# Patient Record
Sex: Female | Born: 1975 | Marital: Single | Smoking: Light tobacco smoker
Health system: Southern US, Community
[De-identification: ages and names within clinical notes are randomized; demographics above are authoritative.]

## PROBLEM LIST (undated history)

## (undated) DIAGNOSIS — F419 Anxiety disorder, unspecified: Secondary | ICD-10-CM

## (undated) DIAGNOSIS — T7840XA Allergy, unspecified, initial encounter: Secondary | ICD-10-CM

## (undated) DIAGNOSIS — K1120 Sialoadenitis, unspecified: Secondary | ICD-10-CM

## (undated) DIAGNOSIS — D649 Anemia, unspecified: Secondary | ICD-10-CM

## (undated) HISTORY — DX: Allergy, unspecified, initial encounter: T78.40XA

## (undated) HISTORY — DX: Anxiety disorder, unspecified: F41.9

## (undated) HISTORY — PX: BREAST SURGERY: SHX581

## (undated) HISTORY — PX: TUBAL LIGATION: SHX77

## (undated) HISTORY — DX: Anemia, unspecified: D64.9

## (undated) HISTORY — DX: Sialoadenitis, unspecified: K11.20

---

## 2015-12-25 DIAGNOSIS — K1120 Sialoadenitis, unspecified: Secondary | ICD-10-CM

## 2015-12-25 HISTORY — DX: Sialoadenitis, unspecified: K11.20

## 2016-10-10 DIAGNOSIS — H40003 Preglaucoma, unspecified, bilateral: Secondary | ICD-10-CM | POA: Diagnosis not present

## 2016-11-06 DIAGNOSIS — R071 Chest pain on breathing: Secondary | ICD-10-CM | POA: Diagnosis not present

## 2016-11-06 DIAGNOSIS — R52 Pain, unspecified: Secondary | ICD-10-CM | POA: Diagnosis not present

## 2016-11-06 DIAGNOSIS — K219 Gastro-esophageal reflux disease without esophagitis: Secondary | ICD-10-CM | POA: Diagnosis not present

## 2016-11-06 DIAGNOSIS — F419 Anxiety disorder, unspecified: Secondary | ICD-10-CM | POA: Diagnosis not present

## 2016-11-06 DIAGNOSIS — Z889 Allergy status to unspecified drugs, medicaments and biological substances status: Secondary | ICD-10-CM | POA: Diagnosis not present

## 2016-11-06 DIAGNOSIS — R079 Chest pain, unspecified: Secondary | ICD-10-CM | POA: Diagnosis not present

## 2016-11-25 DIAGNOSIS — Z6827 Body mass index (BMI) 27.0-27.9, adult: Secondary | ICD-10-CM | POA: Diagnosis not present

## 2016-11-25 DIAGNOSIS — Z01411 Encounter for gynecological examination (general) (routine) with abnormal findings: Secondary | ICD-10-CM | POA: Diagnosis not present

## 2016-11-25 DIAGNOSIS — N921 Excessive and frequent menstruation with irregular cycle: Secondary | ICD-10-CM | POA: Diagnosis not present

## 2017-03-11 DIAGNOSIS — N921 Excessive and frequent menstruation with irregular cycle: Secondary | ICD-10-CM | POA: Diagnosis not present

## 2017-03-11 DIAGNOSIS — D250 Submucous leiomyoma of uterus: Secondary | ICD-10-CM | POA: Diagnosis not present

## 2017-03-11 DIAGNOSIS — D252 Subserosal leiomyoma of uterus: Secondary | ICD-10-CM | POA: Diagnosis not present

## 2017-03-11 DIAGNOSIS — D251 Intramural leiomyoma of uterus: Secondary | ICD-10-CM | POA: Diagnosis not present

## 2017-03-11 DIAGNOSIS — Z3202 Encounter for pregnancy test, result negative: Secondary | ICD-10-CM | POA: Diagnosis not present

## 2017-03-22 HISTORY — PX: ABDOMINAL HYSTERECTOMY: SHX81

## 2017-03-31 DIAGNOSIS — D250 Submucous leiomyoma of uterus: Secondary | ICD-10-CM | POA: Diagnosis not present

## 2017-03-31 DIAGNOSIS — D252 Subserosal leiomyoma of uterus: Secondary | ICD-10-CM | POA: Diagnosis not present

## 2017-03-31 DIAGNOSIS — D251 Intramural leiomyoma of uterus: Secondary | ICD-10-CM | POA: Diagnosis not present

## 2017-03-31 DIAGNOSIS — N939 Abnormal uterine and vaginal bleeding, unspecified: Secondary | ICD-10-CM | POA: Diagnosis not present

## 2017-04-03 DIAGNOSIS — R109 Unspecified abdominal pain: Secondary | ICD-10-CM | POA: Diagnosis not present

## 2017-04-03 DIAGNOSIS — D259 Leiomyoma of uterus, unspecified: Secondary | ICD-10-CM | POA: Diagnosis not present

## 2017-04-03 DIAGNOSIS — G8918 Other acute postprocedural pain: Secondary | ICD-10-CM | POA: Diagnosis not present

## 2017-04-03 DIAGNOSIS — D251 Intramural leiomyoma of uterus: Secondary | ICD-10-CM | POA: Diagnosis not present

## 2017-04-03 DIAGNOSIS — K219 Gastro-esophageal reflux disease without esophagitis: Secondary | ICD-10-CM | POA: Diagnosis not present

## 2017-04-03 DIAGNOSIS — N920 Excessive and frequent menstruation with regular cycle: Secondary | ICD-10-CM | POA: Diagnosis not present

## 2017-04-03 DIAGNOSIS — N939 Abnormal uterine and vaginal bleeding, unspecified: Secondary | ICD-10-CM | POA: Diagnosis not present

## 2017-04-03 DIAGNOSIS — Z79899 Other long term (current) drug therapy: Secondary | ICD-10-CM | POA: Diagnosis not present

## 2017-04-03 DIAGNOSIS — N840 Polyp of corpus uteri: Secondary | ICD-10-CM | POA: Diagnosis not present

## 2017-04-03 DIAGNOSIS — D252 Subserosal leiomyoma of uterus: Secondary | ICD-10-CM | POA: Diagnosis not present

## 2017-04-03 DIAGNOSIS — D250 Submucous leiomyoma of uterus: Secondary | ICD-10-CM | POA: Diagnosis not present

## 2017-04-03 DIAGNOSIS — J302 Other seasonal allergic rhinitis: Secondary | ICD-10-CM | POA: Diagnosis not present

## 2017-04-03 DIAGNOSIS — Z791 Long term (current) use of non-steroidal anti-inflammatories (NSAID): Secondary | ICD-10-CM | POA: Diagnosis not present

## 2017-04-03 DIAGNOSIS — Z7951 Long term (current) use of inhaled steroids: Secondary | ICD-10-CM | POA: Diagnosis not present

## 2017-04-03 DIAGNOSIS — F419 Anxiety disorder, unspecified: Secondary | ICD-10-CM | POA: Diagnosis not present

## 2017-04-21 DIAGNOSIS — Z9889 Other specified postprocedural states: Secondary | ICD-10-CM | POA: Diagnosis not present

## 2017-04-21 DIAGNOSIS — Z90711 Acquired absence of uterus with remaining cervical stump: Secondary | ICD-10-CM | POA: Diagnosis not present

## 2017-04-29 DIAGNOSIS — D649 Anemia, unspecified: Secondary | ICD-10-CM | POA: Diagnosis not present

## 2017-05-21 DIAGNOSIS — D649 Anemia, unspecified: Secondary | ICD-10-CM | POA: Diagnosis not present

## 2017-05-21 DIAGNOSIS — B373 Candidiasis of vulva and vagina: Secondary | ICD-10-CM | POA: Diagnosis not present

## 2017-05-21 DIAGNOSIS — R1030 Lower abdominal pain, unspecified: Secondary | ICD-10-CM | POA: Diagnosis not present

## 2017-05-21 DIAGNOSIS — Z09 Encounter for follow-up examination after completed treatment for conditions other than malignant neoplasm: Secondary | ICD-10-CM | POA: Diagnosis not present

## 2017-10-02 ENCOUNTER — Other Ambulatory Visit: Payer: Self-pay

## 2017-10-02 ENCOUNTER — Ambulatory Visit: Payer: BLUE CROSS/BLUE SHIELD | Admitting: Physician Assistant

## 2017-10-02 ENCOUNTER — Encounter: Payer: Self-pay | Admitting: Physician Assistant

## 2017-10-02 VITALS — BP 106/86 | HR 88 | Temp 98.3°F | Resp 16 | Ht 59.5 in | Wt 131.0 lb

## 2017-10-02 DIAGNOSIS — G8929 Other chronic pain: Secondary | ICD-10-CM | POA: Diagnosis not present

## 2017-10-02 DIAGNOSIS — K30 Functional dyspepsia: Secondary | ICD-10-CM | POA: Diagnosis not present

## 2017-10-02 DIAGNOSIS — R1013 Epigastric pain: Secondary | ICD-10-CM

## 2017-10-02 DIAGNOSIS — J302 Other seasonal allergic rhinitis: Secondary | ICD-10-CM

## 2017-10-02 MED ORDER — ESOMEPRAZOLE MAGNESIUM 20 MG PO CPDR: 20.0000 mg | DELAYED_RELEASE_CAPSULE | Freq: Every day | ORAL | 0 refills | Status: DC

## 2017-10-02 MED ORDER — CETIRIZINE HCL 10 MG PO TABS: 10.0000 mg | ORAL_TABLET | Freq: Every day | ORAL | 3 refills | Status: DC

## 2017-10-02 MED ORDER — FLUTICASONE PROPIONATE 50 MCG/ACT NA SUSP: 2.0000 | Freq: Every day | NASAL | 12 refills | Status: DC

## 2017-10-02 MED ORDER — RANITIDINE HCL 150 MG PO TABS
150.0000 mg | ORAL_TABLET | Freq: Two times a day (BID) | ORAL | 0 refills | Status: DC
Start: 2017-10-02 — End: 2017-11-24

## 2017-10-02 NOTE — Patient Instructions (Addendum)
I would like you to start taking daily nexium. This can take a little bit to become effective so in the meantime you can also use zantac up to twice a day before meals for discomfort. Below is info on foods and things to avoid. If no improvement with taking these medications for one month, please return to office. If any of your symptoms worsen or you develop new fever, right upper quadrant pain, vomiting, or worsening pain, please seek care immediately.    Indigestion Indigestion is a feeling of pain, discomfort, burning, or fullness in the upper part of your belly (abdomen). It can come and go. It may occur often or rarely. Indigestion tends to happen while you are eating or right after you have finished eating. It may be worse at night and while bending over or lying down. Follow these instructions at home: Take these actions to lessen your pain or discomfort and to help avoid problems. Diet  Follow a diet as told by your doctor. You may need to avoid foods and drinks such as: ? Coffee and tea (with or without caffeine). ? Drinks that contain alcohol. ? Energy drinks and sports drinks. ? Carbonated drinks or sodas. ? Chocolate and cocoa. ? Peppermint and mint flavorings. ? Garlic and onions. ? Horseradish. ? Spicy and acidic foods, such as peppers, chili powder, curry powder, vinegar, hot sauces, and BBQ sauce. ? Citrus fruit juices and citrus fruits, such as oranges, lemons, and limes. ? Tomato-based foods, such as red sauce, chili, salsa, and pizza with red sauce. ? Fried and fatty foods, such as donuts, french fries, potato chips, and high-fat dressings. ? High-fat meats, such as hot dogs, rib eye steak, sausage, ham, and bacon. ? High-fat dairy items, such as whole milk, butter, and cream cheese.  Eat small meals often. Avoid eating large meals.  Avoid drinking large amounts of liquid with your meals.  Avoid eating meals during the 2-3 hours before bedtime.  Avoid lying down  right after you eat.  Do not exercise right after you eat. General instructions  Pay attention to any changes in your symptoms.  Take over-the-counter and prescription medicines only as told by your doctor. Do not take aspirin, ibuprofen, or other NSAIDs unless your doctor says it is okay.  Do not use any tobacco products, including cigarettes, chewing tobacco, and e-cigarettes. If you need help quitting, ask your doctor.  Wear loose clothes. Do not wear anything tight around your waist.  Raise (elevate) the head of your bed about 6 inches (15 cm).  Try to lower your stress. If you need help doing this, ask your doctor.  If you are overweight, lose an amount of weight that is healthy for you. Ask your doctor about a safe weight loss goal.  Keep all follow-up visits as told by your doctor. This is important. Contact a doctor if:  You have new symptoms.  You lose weight and you do not know why it is happening.  You have trouble swallowing, or it hurts to swallow.  Your symptoms do not get better with treatment.  Your symptoms last for more than two days.  You have a fever.  You throw up (vomit). Get help right away if:  You have pain in your arms, neck, jaw, teeth, or back.  You feel sweaty, dizzy, or light-headed.  You pass out (faint).  You have chest pain or shortness of breath.  You cannot stop throwing up, or you throw up blood.  Your  poop (stool) is bloody or black.  You have very bad pain in your belly. This information is not intended to replace advice given to you by your health care provider. Make sure you discuss any questions you have with your health care provider. Document Released: 08/10/2010 Document Revised: 12/14/2015 Document Reviewed: 11/02/2014 Elsevier Interactive Patient Education  2018 [employer]     IF you received an x-ray today, you will receive an invoice from [facility]. Please contact [facility] at  [phone] with questions or concerns regarding your invoice.   IF you received labwork today, you will receive an invoice from [facility]. Please contact [facility] at [phone] with questions or concerns regarding your invoice.   Our billing staff will not be able to assist you with questions regarding bills from these companies.  You will be contacted with the lab results as soon as they are available. The fastest way to get your results is to activate your My Chart account. Instructions are located on the last page of this paperwork. If you have not heard from us regarding the results in 2 weeks, please contact this office.

## 2017-10-02 NOTE — Progress Notes (Signed)
Jodi Acosta  MRN: [id number] DOB: 1975-11-22  Subjective:     Jodi Acosta is a 42 y.o. female who presents for evaluation of abdominal pain. Onset was 1 month ago. Symptoms have been gradually worsening. The pain is described as burning, and is 6/10 in intensity. Pain is located in the epigastric region without radiation. Had similar symptoms a couple years ago and was given omeprazole but stopped taking it due to side effects.  Aggravating factors: none.  Alleviating factors: cold liquids. Associated symptoms: nausea, heartburn, belching.  Has had some weight loss in the past year (~8 lbs).  The patient denies anorexia, arthralagias, chills, constipation, diarrhea, dysuria, fever, flatus, frequency, headache, hematochezia, hematuria, melena, myalgias, sweats and vomiting. Pt has BM every 3 days. Pt has tried tums and pepto bismol. No excess use of NSAIDs. Has well balanced diet. Drinks wine occasionally. Smokes cigarettes occasionally. Has had some increased stress at work. Has PSH of hysterectomy. Has FH of cancer but not sure which kind or who in.   Also needs medication refills for allergies. Has hx of seasonal allergies. Typically takes daily zyrtec and Flonase but does not have any left.   Review of Systems  Constitutional: Negative for diaphoresis, fatigue and fever.  HENT: Positive for congestion, sinus pressure and sneezing.   Respiratory: Negative for cough, shortness of breath and wheezing.   Cardiovascular: Negative for chest pain and palpitations.  Gastrointestinal: Negative for abdominal distention and blood in stool.  Neurological: Negative for dizziness and light-headedness.    There are no active problems to display for this patient.   No current outpatient medications on file prior to visit.   No current facility-administered medications on file prior to visit.     No Known Allergies     Social History   Socioeconomic History  . Marital status: Single   Spouse name: Not on file  . Number of children: Not on file  . Years of education: Not on file  . Highest education level: Not on file  Social Needs  . Financial resource strain: Not on file  . Food insecurity - worry: Not on file  . Food insecurity - inability: Not on file  . Transportation needs - medical: Not on file  . Transportation needs - non-medical: Not on file  Occupational History  . Not on file  Tobacco Use  . Smoking status: Light Tobacco Smoker    Types: Cigarettes  . Smokeless tobacco: Never Used  . Tobacco comment: occasionally  Substance and Sexual Activity  . Alcohol use: Yes    Alcohol/week: 0.6 oz    Types: 1 Glasses of wine per week    Comment: social  . Drug use: No  . Sexual activity: Not on file  Other Topics Concern  . Not on file  Social History Narrative  . Not on file    Objective:  BP 106/86   Pulse 88   Temp 98.3 F (36.8 C) (Oral)   Resp 16   Ht 4' 11.5" (1.511 m)   Wt 131 lb (59.4 kg)   SpO2 98%   BMI 26.02 kg/m   Physical Exam  Constitutional: She is oriented to person, place, and time and well-developed, well-nourished, and in no distress.  HENT:  Head: Normocephalic and atraumatic.  Right Ear: Tympanic membrane, external ear and ear canal normal.  Left Ear: Tympanic membrane and external ear normal.  Nose: Mucosal edema (pale boggy swollen turbinates bilaterally) present. Right sinus exhibits no maxillary sinus tenderness  and no frontal sinus tenderness. Left sinus exhibits no maxillary sinus tenderness and no frontal sinus tenderness.  Eyes: Conjunctivae are normal.  Neck: Normal range of motion.  Pulmonary/Chest: Effort normal.  Abdominal: Soft. Normal appearance and bowel sounds are normal. There is no hepatosplenomegaly. There is tenderness (mild) in the epigastric area. There is no rigidity, no guarding, no CVA tenderness, no tenderness at McBurney's point and negative Murphy's sign.  Neurological: She is alert and oriented  to person, place, and time. Gait normal.  Skin: Skin is warm and dry.  Psychiatric: Affect normal.  Vitals reviewed.  Wt Readings from Last 3 Encounters:  10/02/17 131 lb (59.4 kg)     Assessment and Plan :  1. Abdominal pain, chronic, epigastric History and physical exam findings most consistent for underlying peptic ulcer disease versus GERD. Patient is overall well-appearing, no distress.  Vital stable.  She has mild tenderness  in the epigastric region on exam. Labs pending.  Recommend treatment with daily PPI for [redacted] weeks along with Zantac for short-term relief while PPI is taking effect.  Given list of foods to avoid.  There is no palpable mass noted on exam.  Patient does have a history of weight loss but we do not have a documented prior weight.  If no improvement in 4 weeks with the current treatment, will refer to GI for potential upper endoscopy for further evaluation.  Do not suspect biliary etiology at this time as this has been chronic in nature, pt does not have any right upper quadrant pain, and is afebrile.  However if symptoms worsen or change, can consider right upper quadrant ultrasound.  Advised to return to clinic if symptoms worsen, do not improve, or as needed - CBC with Differential/Platelet - CMP14+EGFR - Lipase - esomeprazole (NEXIUM) 20 MG capsule; Take 1 capsule (20 mg total) by mouth daily.  Dispense: 30 capsule; Refill: 0 - ranitidine (ZANTAC) 150 MG tablet; Take 1 tablet (150 mg total) by mouth 2 (two) times daily with a meal.  Dispense: 60 tablet; Refill: 0 - H. pylori antibody, IgG  2. Indigestion 3. Seasonal allergic rhinitis, unspecified trigger - cetirizine (ZYRTEC) 10 MG tablet; Take 1 tablet (10 mg total) by mouth daily.  Dispense: 90 tablet; Refill: 3 - fluticasone (FLONASE) 50 MCG/ACT nasal spray; Place 2 sprays into both nostrils daily.  Dispense: 16 g; Refill: [place] PA-C  [facility] at [place] 10/02/2017  4:09 PM

## 2017-10-03 LAB — CMP14+EGFR
ALT: 13 IU/L (ref 0–32)
AST: 26 IU/L (ref 0–40)
Albumin/Globulin Ratio: 1.5 (ref 1.2–2.2)
Albumin: 4.8 g/dL (ref 3.5–5.5)
Alkaline Phosphatase: 60 IU/L (ref 39–117)
BUN / CREAT RATIO: 10 (ref 9–23)
BUN: 8 mg/dL (ref 6–20)
Bilirubin Total: 0.2 mg/dL (ref 0.0–1.2)
CALCIUM: 9.6 mg/dL (ref 8.7–10.2)
CO2: 20 mmol/L (ref 20–29)
CREATININE: 0.8 mg/dL (ref 0.57–1.00)
Chloride: 103 mmol/L (ref 96–106)
GFR calc Af Amer: 107 mL/min/{1.73_m2} (ref >59)
GFR, EST NON AFRICAN AMERICAN: 93 mL/min/{1.73_m2} (ref >59)
GLOBULIN, TOTAL: 3.2 g/dL (ref 1.5–4.5)
GLUCOSE: 73 mg/dL (ref 65–99)
Potassium: 3.8 mmol/L (ref 3.5–5.2)
SODIUM: 140 mmol/L (ref 134–144)
Total Protein: 8 g/dL (ref 6.0–8.5)

## 2017-10-03 LAB — CBC WITH DIFFERENTIAL/PLATELET
BASOS ABS: 0 10*3/uL (ref 0.0–0.2)
Basos: 0 %
EOS (ABSOLUTE): 0.1 10*3/uL (ref 0.0–0.4)
EOS: 1 %
HEMOGLOBIN: 12.2 g/dL (ref 11.1–15.9)
Hematocrit: 37.3 % (ref 34.0–46.6)
IMMATURE GRANS (ABS): 0 10*3/uL (ref 0.0–0.1)
IMMATURE GRANULOCYTES: 0 %
LYMPHS: 37 %
Lymphocytes Absolute: 3 10*3/uL (ref 0.7–3.1)
MCH: 25.8 pg — ABNORMAL LOW (ref 26.6–33.0)
MCHC: 32.7 g/dL (ref 31.5–35.7)
MCV: 79 fL (ref 79–97)
MONOCYTES: 6 %
Monocytes Absolute: 0.5 10*3/uL (ref 0.1–0.9)
NEUTROS PCT: 56 %
Neutrophils Absolute: 4.4 10*3/uL (ref 1.4–7.0)
Platelets: 367 10*3/uL (ref 150–379)
RBC: 4.72 x10E6/uL (ref 3.77–5.28)
RDW: 17.8 % — ABNORMAL HIGH (ref 12.3–15.4)
WBC: 7.9 10*3/uL (ref 3.4–10.8)

## 2017-10-03 LAB — H. PYLORI ANTIBODY, IGG: H. pylori, IgG AbS: <0.8 Index Value (ref 0.00–0.79)

## 2017-10-03 LAB — LIPASE: LIPASE: 40 U/L (ref 14–72)

## 2017-10-07 DIAGNOSIS — R112 Nausea with vomiting, unspecified: Secondary | ICD-10-CM | POA: Diagnosis not present

## 2017-10-07 DIAGNOSIS — J018 Other acute sinusitis: Secondary | ICD-10-CM | POA: Diagnosis not present

## 2017-10-07 DIAGNOSIS — R0602 Shortness of breath: Secondary | ICD-10-CM | POA: Diagnosis not present

## 2017-10-25 DIAGNOSIS — F419 Anxiety disorder, unspecified: Secondary | ICD-10-CM | POA: Diagnosis not present

## 2017-10-25 DIAGNOSIS — Z79899 Other long term (current) drug therapy: Secondary | ICD-10-CM | POA: Diagnosis not present

## 2017-10-25 DIAGNOSIS — R072 Precordial pain: Secondary | ICD-10-CM | POA: Diagnosis not present

## 2017-10-25 DIAGNOSIS — F329 Major depressive disorder, single episode, unspecified: Secondary | ICD-10-CM | POA: Diagnosis not present

## 2017-10-29 ENCOUNTER — Other Ambulatory Visit: Payer: Self-pay | Admitting: Physician Assistant

## 2017-10-29 DIAGNOSIS — G8929 Other chronic pain: Secondary | ICD-10-CM

## 2017-10-29 DIAGNOSIS — R1013 Epigastric pain: Principal | ICD-10-CM

## 2017-11-11 ENCOUNTER — Other Ambulatory Visit: Payer: Self-pay

## 2017-11-11 ENCOUNTER — Encounter: Payer: Self-pay | Admitting: Physician Assistant

## 2017-11-11 ENCOUNTER — Ambulatory Visit: Payer: BLUE CROSS/BLUE SHIELD | Admitting: Physician Assistant

## 2017-11-11 VITALS — BP 112/88 | HR 89 | Temp 99.1°F | Resp 16 | Ht 59.5 in | Wt 132.0 lb

## 2017-11-11 DIAGNOSIS — L309 Dermatitis, unspecified: Secondary | ICD-10-CM

## 2017-11-11 DIAGNOSIS — Z889 Allergy status to unspecified drugs, medicaments and biological substances status: Secondary | ICD-10-CM | POA: Insufficient documentation

## 2017-11-11 DIAGNOSIS — F410 Panic disorder [episodic paroxysmal anxiety] without agoraphobia: Secondary | ICD-10-CM | POA: Diagnosis not present

## 2017-11-11 DIAGNOSIS — F419 Anxiety disorder, unspecified: Secondary | ICD-10-CM | POA: Insufficient documentation

## 2017-11-11 DIAGNOSIS — K219 Gastro-esophageal reflux disease without esophagitis: Secondary | ICD-10-CM | POA: Insufficient documentation

## 2017-11-11 DIAGNOSIS — T7840XA Allergy, unspecified, initial encounter: Secondary | ICD-10-CM | POA: Insufficient documentation

## 2017-11-11 MED ORDER — LORAZEPAM 1 MG PO TABS: 1.0000 mg | ORAL_TABLET | Freq: Two times a day (BID) | ORAL | 0 refills | Status: DC

## 2017-11-11 MED ORDER — TRIAMCINOLONE ACETONIDE 0.1 % EX CREA: 1.0000 "application " | TOPICAL_CREAM | Freq: Two times a day (BID) | CUTANEOUS | 1 refills | Status: AC

## 2017-11-11 MED ORDER — HYDROXYZINE HCL 25 MG PO TABS: 12.5000 mg | ORAL_TABLET | Freq: Every evening | ORAL | 0 refills | Status: DC | PRN

## 2017-11-11 NOTE — Progress Notes (Signed)
Patient ID: Jodi LukesCynthia Acosta, female    DOB: 12/09/1975, 42 y.o.   MRN: [id number]  PCP: Patient, No Pcp Per  Chief Complaint  Patient presents with  . Rash    on upper body, face, arm, given ativan and wellbutrin  . Medication Refill    ativan and wellbutrin     Subjective:   Presents for evaluation of a very itchy rash.  Rash began 2-3 weeks ago. Has been getting worse x 1 week. Initially felt itching of the back, then the arms, anterior trunk. Today noticed it on her face. No new skin care products, detergents, cleaning supplies, pets, other exposures. New medications recently (bupropion and lorazepam). No previous rash like this.  Itching less with cold showers. Benadryl and cetirizine. No benefit to the cetirizine alone.  Panic attack several weeks ago, evaluated at [facility].  (She has been here one previous time, and there on a couple of occasions.) Had not yet developed the rash at that time. Given 14-days of bupropion and lorazepam, and needs more.  Prior to that panic attack, she's experienced daily stress and anxiety symptoms. No depression. Loss of family member, in a traumatic way. The lorazepam helps her sleep, she takes it every night, and during the day if she starts to feel her heart rate increase and early anxiety symptoms (about 4 times total). Takes the bupropion every morning. Has previously taken a benzodiazepine, more than 10 years ago, without adverse effects. Doesn't recall what it was, but took it for a similar stressful event, only short term. Did not experience a panic attack then.   Review of Systems As above.    Patient Active Problem List   Diagnosis Date Noted  . H/O seasonal allergies 11/11/2017  . GERD (gastroesophageal reflux disease) 11/11/2017  . Anxiety 11/11/2017  . Allergy 11/11/2017  . Parotiditis 12/25/2015     Prior to Admission medications   Medication Sig Start Date End Date Taking? Authorizing  Provider  buPROPion (WELLBUTRIN XL) 150 MG 24 hr tablet  10/25/17  Yes [provider]  cetirizine (ZYRTEC) 10 MG tablet Take 1 tablet (10 mg total) by mouth daily. 10/02/17  Yes Benjiman CoreWiseman, Brittany D, PA-C  LORazepam (ATIVAN) 1 MG tablet  10/25/17  Yes [provider]  ranitidine (ZANTAC) 150 MG tablet Take 1 tablet (150 mg total) by mouth 2 (two) times daily with a meal. 10/02/17  Yes Barnett AbuWiseman, [place] D, PA-C  esomeprazole (NEXIUM) 20 MG capsule TAKE 1 CAPSULE BY MOUTH EVERY DAY Patient not taking: Reported on 11/11/2017 10/29/17   Benjiman CoreWiseman, Brittany D, PA-C  fluticasone [facility](FLONASE) 50 MCG/ACT nasal spray Place 2 sprays into both nostrils daily. Patient not taking: Reported on 11/11/2017 10/02/17   Benjiman CoreWiseman, Brittany D, PA-C     No Known Allergies     Objective:  Physical Exam  Constitutional: She is oriented to person, place, and time. She appears well-developed and well-nourished. She is active and cooperative. No distress.  BP 112/88   Pulse 89   Temp 99.1 F (37.3 C)   Resp 16   Ht 4' 11.5" (1.511 m)   Wt 132 lb (59.9 kg)   SpO2 98%   BMI 26.21 kg/m   HENT:  Head: Normocephalic and atraumatic.  Right Ear: Hearing normal.  Left Ear: Hearing normal.  Eyes: Conjunctivae are normal. No scleral icterus.  Neck: Normal range of motion. Neck supple. No thyromegaly present.  Cardiovascular: Normal rate, regular rhythm and normal heart sounds.  Pulses:      Radial pulses are 2+ on the right side, and 2+ on the left side.  Pulmonary/Chest: Effort normal and breath sounds normal.  Lymphadenopathy:       Head (right side): No tonsillar, no preauricular, no posterior auricular and no occipital adenopathy present.       Head (left side): No tonsillar, no preauricular, no posterior auricular and no occipital adenopathy present.    She has no cervical adenopathy.       Right: No supraclavicular adenopathy present.       Left: No supraclavicular adenopathy present.  Neurological:  She is alert and oriented to person, place, and time. No sensory deficit.  Skin: Skin is warm, dry and intact. Rash noted. No cyanosis or erythema. Nails show no clubbing.  Ovoid plaque-like lesions on the truck, upper arms, neck and face. Slightly raised edges, some with appearance of mild central clearing, some with fine scale. Distribution along skin lines. One larger lesion on the RIGHT lower back, another on the RIGHT upper inner arm.  Psychiatric: Her speech is normal and behavior is normal. Judgment and thought content normal. Her mood appears not anxious. Her affect is labile. Her affect is not angry, not blunt and not inappropriate. Cognition and memory are normal. She does not exhibit a depressed mood.       Assessment & Plan:   1. Dermatitis This looks like pityriasis rosea, but the timing with initiation of bupropion and lorazepam is concerning. STOP the bupropion, and treat itching symptomatically. Re-evaluate in 2 weeks. - hydrOXYzine (ATARAX/VISTARIL) 25 MG tablet; Take 0.5-3 tablets (12.5-75 mg total) by mouth at bedtime as needed for itching.  Dispense: 30 tablet; Refill: 0 - triamcinolone cream (KENALOG) 0.1 %; Apply 1 application topically 2 (two) times daily.  Dispense: 45 g; Refill: 1  2. Panic attack STOP bupropion, which may be the cause of her rash, though it looks more like pityriasis rosea than allergic/reactive. Reassess anxiety symptoms at follow-up, and depending on dermatitis resolution, consider treatments for anxiety going forward. - LORazepam (ATIVAN) 1 MG tablet; Take 1 tablet (1 mg total) by mouth 2 (two) times daily.  Dispense: 30 tablet; Refill: 0    Return in about 2 weeks (around 11/25/2017) for re-evaluation of rash and panic/anxiety.   [name], PA-C Primary Care at [facility]

## 2017-11-11 NOTE — Patient Instructions (Addendum)
STOP the bupropion (Wellbutrin). CONTINUE the lorazepam (Ativan) at bedtime*, and as needed. STOP the Benadryl CONTINUE the cetirizine each day. ADD the hydroxyzine at bedtime.  * the hydroxyzine is very sedating. You may find that you don't need the lorazepam at bedtime when you take it.    IF you received an x-ray today, you will receive an invoice from [facility]. Please contact [facility] at [phone] with questions or concerns regarding your invoice.   IF you received labwork today, you will receive an invoice from [facility]. Please contact [facility] at [phone] with questions or concerns regarding your invoice.   Our billing staff will not be able to assist you with questions regarding bills from these companies.  You will be contacted with the lab results as soon as they are available. The fastest way to get your results is to activate your My Chart account. Instructions are located on the last page of this paperwork. If you have not heard from us regarding the results in 2 weeks, please contact this office.    Pityriasis Rosea Pityriasis rosea is a rash that usually appears on the trunk of the body. It may also appear on the upper arms and upper legs. It usually begins as a single patch, and then more patches begin to develop. The rash may cause mild itching, but it normally does not cause other problems. It usually goes away without treatment. However, it may take weeks or months for the rash to go away completely. What are the causes? The cause of this condition is not known. The condition does not spread from person to person (is noncontagious). What increases the risk? This condition is more likely to develop in young adults and children. It is most common in the spring and fall. What are the signs or symptoms? The main symptom of this condition is a rash.  The rash usually begins with a single oval patch that is larger than the ones that follow. This  is called a herald patch. It generally appears a week or more before the rest of the rash appears.  When more patches start to develop, they spread quickly on the trunk, back, and arms. These patches are smaller than the first one.  The patches that make up the rash are usually oval-shaped and pink or red in color. They are usually flat, but they may sometimes be raised so that they can be felt with a finger. They may also be finely crinkled and have a scaly ring around the edge.  The rash does not typically appear on areas of the skin that are exposed to the sun.  Most people who have this condition do not have other symptoms, but some have mild itching. In a few cases, a mild headache or body aches may occur before the rash appears and then go away. How is this diagnosed? Your health care provider may diagnose this condition by doing a physical exam and taking your medical history. To rule out other possible causes for the rash, the health care provider may order blood tests or take a skin sample from the rash to be looked at under a microscope. How is this treated? Usually, treatment is not needed for this condition. The rash will probably go away on its own in 4-8 weeks. In some cases, a health care provider may recommend or prescribe medicine to reduce itching. Follow these instructions at home:  Take medicines only as directed by your health care provider.  Avoid scratching  the affected areas of skin.  Do not take hot baths or use a sauna. Use only warm water when bathing or showering. Heat can increase itching. Contact a health care provider if:  Your rash does not go away in 8 weeks.  Your rash gets much worse.  You have a fever.  You have swelling or pain in the rash area.  You have fluid, blood, or pus coming from the rash area. This information is not intended to replace advice given to you by your health care provider. Make sure you discuss any questions you have with your  health care provider. Document Released: 08/14/2001 Document Revised: 12/14/2015 Document Reviewed: 06/15/2014 Elsevier Interactive Patient Education  [employer].

## 2017-11-21 ENCOUNTER — Other Ambulatory Visit: Payer: Self-pay

## 2017-11-21 ENCOUNTER — Ambulatory Visit (INDEPENDENT_AMBULATORY_CARE_PROVIDER_SITE_OTHER): Payer: BLUE CROSS/BLUE SHIELD | Admitting: Physician Assistant

## 2017-11-21 ENCOUNTER — Encounter: Payer: Self-pay | Admitting: Physician Assistant

## 2017-11-21 VITALS — BP 120/88 | HR 104 | Temp 98.4°F | Resp 16 | Ht 59.5 in | Wt 130.0 lb

## 2017-11-21 DIAGNOSIS — F419 Anxiety disorder, unspecified: Secondary | ICD-10-CM | POA: Diagnosis not present

## 2017-11-21 DIAGNOSIS — L309 Dermatitis, unspecified: Secondary | ICD-10-CM

## 2017-11-21 NOTE — Progress Notes (Signed)
Patient ID: Jodi Acosta, female    DOB: 1976-03-03, 42 y.o.   MRN: [id number]  PCP: Patient, No Pcp Per  Chief Complaint  Patient presents with  . Rash    follow up   . Anxiety    follow up, still the same     Subjective:   Presents for evaluation of dermatitis and anxiety.  "Better. Much Better." No new break outs, no new spots. The current places itch episodically. Hydroxyzine at HS, lorazepam in the mornings. Stopped bupropion. Uses ranitidine occasionally, when she feels heartburn/indigestion.  Because she is sleeping better, she has a good flow during the day.   Continues to experience stress from an event that she doesn't want to discuss. She reports that she tries not to think about it because it upsets her. When she can stay busy, it's easier. Tearful when she thinks or talks about it, so doesn't want to.   Review of Systems As above.    Patient Active Problem List   Diagnosis Date Noted  . H/O seasonal allergies 11/11/2017  . GERD (gastroesophageal reflux disease) 11/11/2017  . Anxiety 11/11/2017  . Allergy 11/11/2017     Prior to Admission medications   Medication Sig Start Date End Date Taking? Authorizing Provider  buPROPion (WELLBUTRIN XL) 150 MG 24 hr tablet  10/25/17   [provider]  cetirizine (ZYRTEC) 10 MG tablet Take 1 tablet (10 mg total) by mouth daily. 10/02/17   Benjiman Core D, PA-C  hydrOXYzine (ATARAX/VISTARIL) 25 MG tablet Take 0.5-3 tablets (12.5-75 mg total) by mouth at bedtime as needed for itching. 11/11/17  Yes Armany Mano, PA-C  LORazepam (ATIVAN) 1 MG tablet Take 1 tablet (1 mg total) by mouth 2 (two) times daily. 11/11/17  Yes Evalyn Shultis, PA-C  ranitidine (ZANTAC) 150 MG tablet Take 1 tablet (150 mg total) by mouth 2 (two) times daily with a meal. 10/02/17  Yes Barnett Abu, [place] D, PA-C  triamcinolone cream (KENALOG) 0.1 % Apply 1 application topically 2 (two) times daily. 11/11/17  Yes Lewie Deman,  PA-C     No Known Allergies     Objective:  Physical Exam  Constitutional: She is oriented to person, place, and time. She appears well-developed and well-nourished. She is active and cooperative. No distress.  BP 120/88   Pulse (!) 104   Temp 98.4 F (36.9 C)   Resp 16   Ht 4' 11.5" (1.511 m)   Wt 130 lb (59 kg)   SpO2 98%   BMI 25.82 kg/m    Eyes: Conjunctivae are normal.  Pulmonary/Chest: Effort normal.  Neurological: She is alert and oriented to person, place, and time.  Psychiatric: Her speech is normal and behavior is normal. Judgment and thought content normal. Her mood appears not anxious. Her affect is labile. Her affect is not angry, not blunt and not inappropriate. Cognition and memory are normal. She does not exhibit a depressed mood.      Assessment & Plan:   Problem List Items Addressed This Visit    Anxiety - Primary    Improving. Encouraged her to seek counseling to help her manage the apparent grief. Reduce lorazepam as able. Consider SSRI if additional treatment is needed.       Other Visit Diagnoses    Dermatitis       Resolving. COntinue skin hygeine and hydroxyzine PRN.       Return in about 2 months (around 01/21/2018), or if symptoms worsen or fail to improve,  for re-evalaution of mood.   [name], PA-C Primary Care at [facility]

## 2017-11-21 NOTE — Patient Instructions (Addendum)
[facility] [phone]  Let me know when your appointment with me is, so that I can make sure you won't run out of medication in the meantime!  IF you received an x-ray today, you will receive an invoice from [facility]. Please contact [facility] at [phone] with questions or concerns regarding your invoice.   IF you received labwork today, you will receive an invoice from [facility]. Please contact [facility] at [phone] with questions or concerns regarding your invoice.   Our billing staff will not be able to assist you with questions regarding bills from these companies.  You will be contacted with the lab results as soon as they are available. The fastest way to get your results is to activate your My Chart account. Instructions are located on the last page of this paperwork. If you have not heard from [place] regarding the results in 2 weeks, please contact this office.

## 2017-11-23 NOTE — Assessment & Plan Note (Signed)
Improving. Encouraged her to seek counseling to help her manage the apparent grief. Reduce lorazepam as able. Consider SSRI if additional treatment is needed.

## 2017-11-24 ENCOUNTER — Other Ambulatory Visit: Payer: Self-pay | Admitting: Physician Assistant

## 2017-11-24 ENCOUNTER — Telehealth: Payer: Self-pay

## 2017-11-24 DIAGNOSIS — G8929 Other chronic pain: Secondary | ICD-10-CM

## 2017-11-24 DIAGNOSIS — R1013 Epigastric pain: Principal | ICD-10-CM

## 2017-11-24 NOTE — Telephone Encounter (Signed)
Copied from CRM [id number]. Topic: General - Other >> Nov 21, 2017 12:00 PM Gerrianne Scale wrote: Reason for CRM: patient calling wanting Chelle to know that she had made an appt at [facility] to see her about her meds

## 2017-12-02 ENCOUNTER — Other Ambulatory Visit: Payer: Self-pay | Admitting: Physician Assistant

## 2017-12-02 DIAGNOSIS — L309 Dermatitis, unspecified: Secondary | ICD-10-CM

## 2017-12-02 DIAGNOSIS — F410 Panic disorder [episodic paroxysmal anxiety] without agoraphobia: Secondary | ICD-10-CM

## 2017-12-02 NOTE — Telephone Encounter (Signed)
Atarax 25 mg and Ativan 1 mg refill requests  LOV 11/21/17 with Porfirio Oar  Last refill Atarax:  11/11/17  #30 Last refill Ativan:  11/11/17   #30  [id number] [place],  - [address]

## 2017-12-03 ENCOUNTER — Other Ambulatory Visit: Payer: Self-pay | Admitting: Physician Assistant

## 2017-12-03 DIAGNOSIS — L309 Dermatitis, unspecified: Secondary | ICD-10-CM

## 2017-12-03 NOTE — Telephone Encounter (Signed)
hydroxyzine refill Last OV: 11/11/17 Last Refill:11/11/17 #30 tablets [id number] [address] [place]

## 2017-12-03 NOTE — Telephone Encounter (Signed)
Patient is requesting a refill of the following medications: Requested Prescriptions   Pending Prescriptions Disp Refills  . hydrOXYzine (ATARAX/VISTARIL) 25 MG tablet [Pharmacy Med Name: HYDROXYZINE HCL 25 MG TABLET] 30 tablet 0    Sig: TAKE 0.5-3 TABLETS (12.5-75 MG TOTAL) BY MOUTH AT BEDTIME AS NEEDED FOR ITCHING.  . LORazepam (ATIVAN) 1 MG tablet [Pharmacy Med Name: LORAZEPAM 1 MG TABLET] 30 tablet 0    Sig: TAKE 1 TABLET BY MOUTH TWICE A DAY    Date of patient request: 12/02/17 Last office visit: 11/21/17 Date of last refill: 11/11/17 Last refill amount: #30 0RF Follow up time period per chart: around 01/21/18 nothing scheduled at this time

## 2017-12-03 NOTE — Telephone Encounter (Signed)
Meds ordered this encounter  Medications  . hydrOXYzine (ATARAX/VISTARIL) 25 MG tablet    Sig: TAKE 0.5-3 TABLETS (12.5-75 MG TOTAL) BY MOUTH AT BEDTIME AS NEEDED FOR ITCHING.    Dispense:  30 tablet    Refill:  0    DX Code Needed  .  Marland Kitchen LORazepam (ATIVAN) 1 MG tablet    Sig: TAKE 1 TABLET BY MOUTH TWICE A DAY    Dispense:  30 tablet    Refill:  0    Not to exceed 5 additional fills before 05/10/2018 DX Code Needed  .

## 2017-12-03 NOTE — Telephone Encounter (Signed)
Patient is requesting a refill of the following medications: Requested Prescriptions   Pending Prescriptions Disp Refills  . hydrOXYzine (ATARAX/VISTARIL) 25 MG tablet 30 tablet 0    Sig: Take 0.5-3 tablets (12.5-75 mg total) by mouth at bedtime as needed for itching.    Date of patient request:12/03/17 Last office visit: 11/21/17 Date of last refill: 11/11/17 Last refill amount: #30 0RF  Follow up time period per chart: appt made @ [facility] about meds per pt

## 2017-12-03 NOTE — Telephone Encounter (Signed)
Copied from CRM [id number]. Topic: Quick Communication - Rx Refill/Question >> Dec 03, 2017 10:33 AM Yvonna Alanis wrote: Medication: HydrOXYzine (ATARAX/VISTARIL) 25 MG tablet Has the patient contacted their pharmacy? Yes.   (Agent: If no, request that the patient contact the pharmacy for the refill.) Preferred Pharmacy (with phone number or street name): [id number] [place], [place] - [address] [phone] (Phone) [phone] (Fax). Patient states that the pharmacy has faxed over this request to the office on yesterday.  Agent: Please be advised that RX refills may take up to 3 business days. We ask that you follow-up with your pharmacy.

## 2018-01-14 ENCOUNTER — Other Ambulatory Visit: Payer: Self-pay | Admitting: Physician Assistant

## 2018-01-14 DIAGNOSIS — F410 Panic disorder [episodic paroxysmal anxiety] without agoraphobia: Secondary | ICD-10-CM

## 2018-01-14 DIAGNOSIS — L309 Dermatitis, unspecified: Secondary | ICD-10-CM

## 2018-01-15 NOTE — Telephone Encounter (Signed)
Previous pt of Chelle, requesting anxiety medication. Last visit 12/07/17.

## 2018-01-16 NOTE — Telephone Encounter (Signed)
 [facility] reviewed Last rx filled 12/03/17, #30 Has appt with chelle jeffrey at [facility] Refilling to cover until then

## 2018-02-11 DIAGNOSIS — R3915 Urgency of urination: Secondary | ICD-10-CM | POA: Diagnosis not present

## 2018-02-11 DIAGNOSIS — N761 Subacute and chronic vaginitis: Secondary | ICD-10-CM | POA: Diagnosis not present

## 2018-02-11 DIAGNOSIS — R319 Hematuria, unspecified: Secondary | ICD-10-CM | POA: Diagnosis not present

## 2018-02-11 DIAGNOSIS — R109 Unspecified abdominal pain: Secondary | ICD-10-CM | POA: Diagnosis not present

## 2018-02-11 DIAGNOSIS — R1032 Left lower quadrant pain: Secondary | ICD-10-CM | POA: Diagnosis not present

## 2018-02-11 DIAGNOSIS — N393 Stress incontinence (female) (male): Secondary | ICD-10-CM | POA: Diagnosis not present

## 2018-02-16 ENCOUNTER — Other Ambulatory Visit: Payer: Self-pay | Admitting: Family Medicine

## 2018-02-16 DIAGNOSIS — F410 Panic disorder [episodic paroxysmal anxiety] without agoraphobia: Secondary | ICD-10-CM

## 2018-02-16 DIAGNOSIS — L309 Dermatitis, unspecified: Secondary | ICD-10-CM

## 2018-02-17 ENCOUNTER — Other Ambulatory Visit: Payer: Self-pay | Admitting: Obstetrics & Gynecology

## 2018-02-17 DIAGNOSIS — R1032 Left lower quadrant pain: Secondary | ICD-10-CM

## 2018-02-17 NOTE — Telephone Encounter (Signed)
Previous patient of Chelle Tinnie GensJeffrey,   Meds refilled already once, she needs to make an appointment for future refills. Meds denied at this time. Please schedule patient. thanks

## 2018-02-19 ENCOUNTER — Ambulatory Visit
Admission: RE | Admit: 2018-02-19 | Discharge: 2018-02-19 | Disposition: A | Discharge status: Home / Self Care | Payer: BLUE CROSS/BLUE SHIELD | Source: Ambulatory Visit | Attending: Obstetrics & Gynecology | Admitting: Obstetrics & Gynecology

## 2018-02-19 DIAGNOSIS — N838 Other noninflammatory disorders of ovary, fallopian tube and broad ligament: Secondary | ICD-10-CM | POA: Diagnosis not present

## 2018-02-19 DIAGNOSIS — R1032 Left lower quadrant pain: Secondary | ICD-10-CM

## 2018-02-19 IMAGING — CT CT ABD-PELV W/ CM
1 of 2 series · 14 of 32 positions shown, 18 images · IV contrast (APPLIED)
Comparison: None.

CLINICAL DATA: Left lower quadrant pain.

EXAM:
CT ABDOMEN AND PELVIS WITH CONTRAST
TECHNIQUE: Multidetector CT imaging of the abdomen and pelvis was performed
using the standard protocol following bolus administration of
intravenous contrast.
CONTRAST:  100mL [MH] IOPAMIDOL ([MH]) INJECTION 61%

[Series 2: abd/pelvis w/cm · axial · 0.74mm/px · z∈[-423,-53]mm · 14 of 82 slices shown, 18 images]
[im 4/82  soft-tissue]
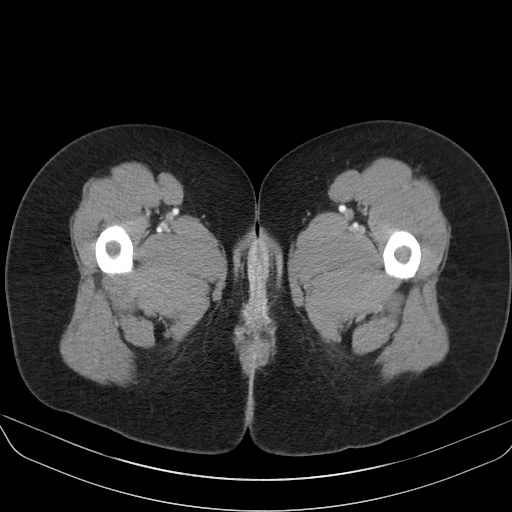
[im 4/82  bone]
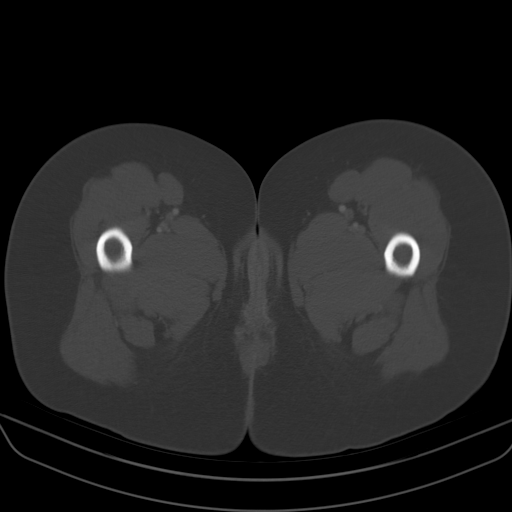
[im 12/82  soft-tissue]
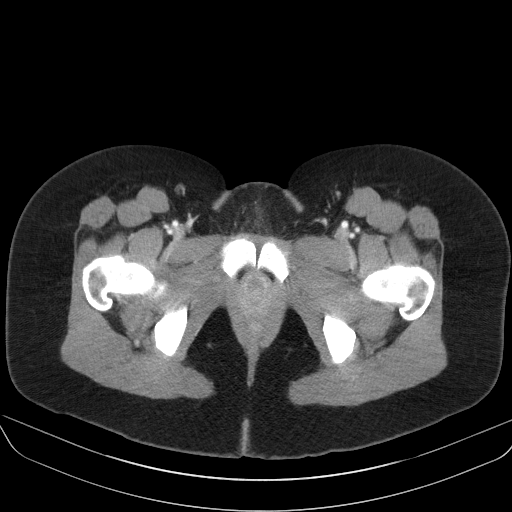
[im 19/82  soft-tissue]
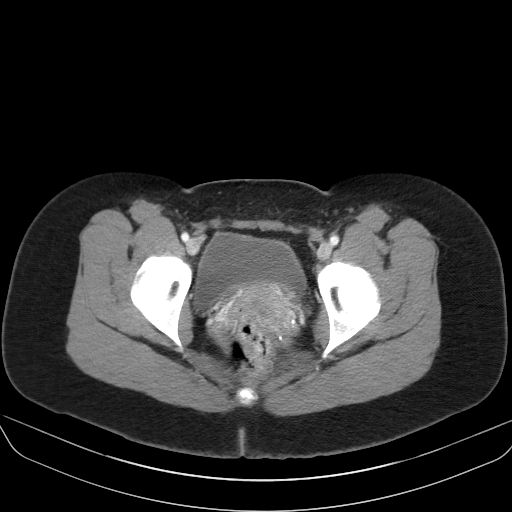
[im 26/82  soft-tissue]
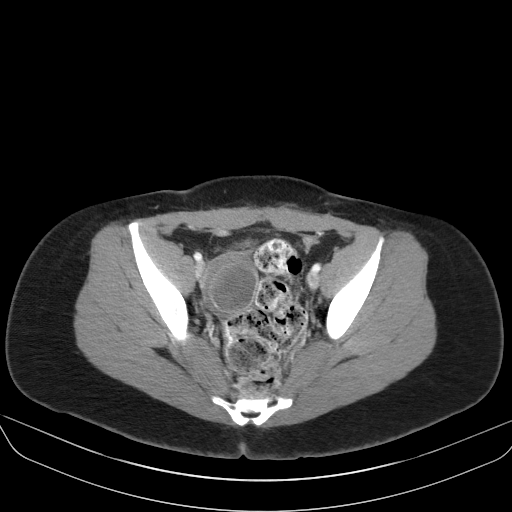
[im 30/82  soft-tissue]
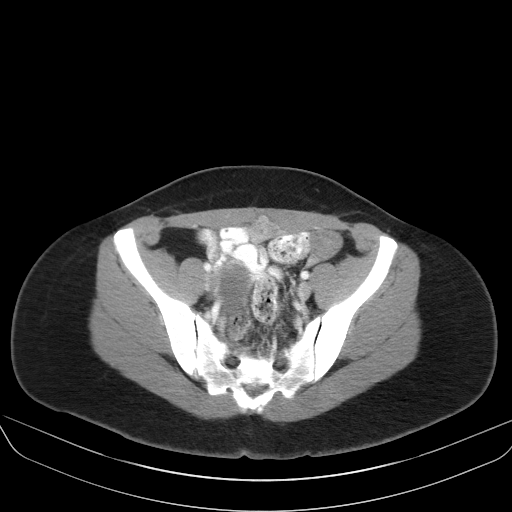
[im 37/82  soft-tissue]
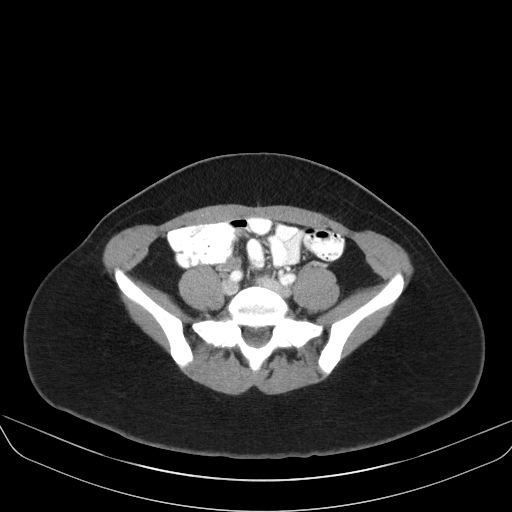
[im 45/82  soft-tissue]
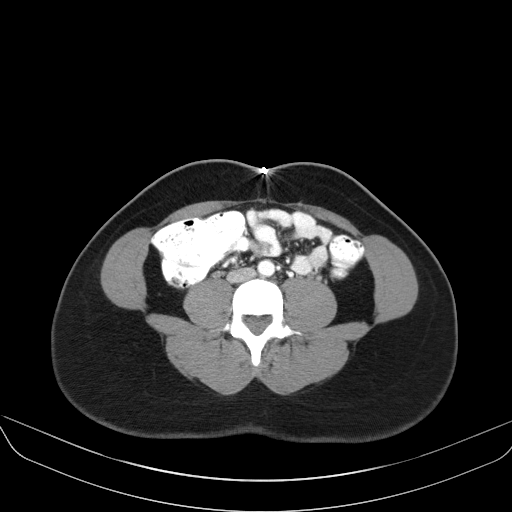
[im 52/82  soft-tissue]
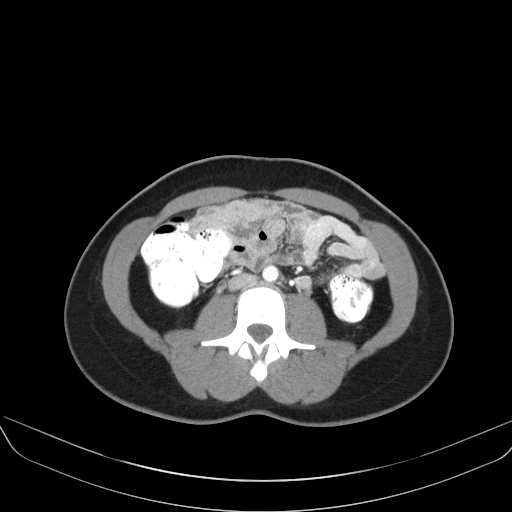
[im 56/82  soft-tissue]
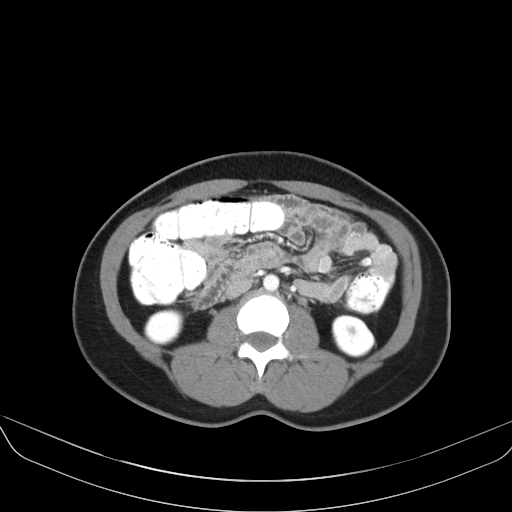
[im 56/82  bone]
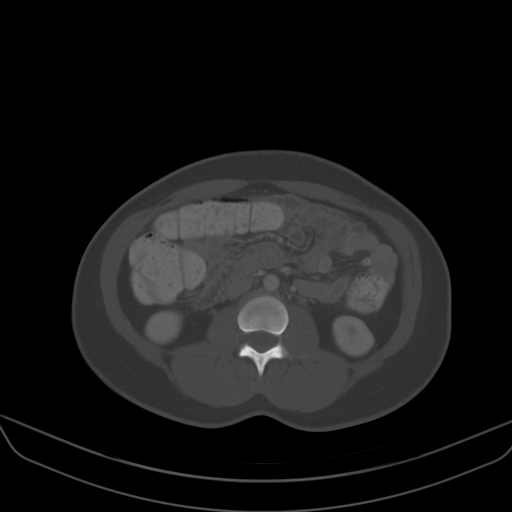
[im 63/82  soft-tissue]
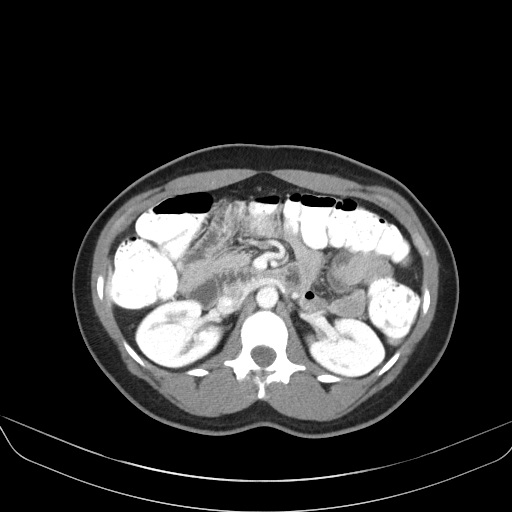
[im 67/82  lung]
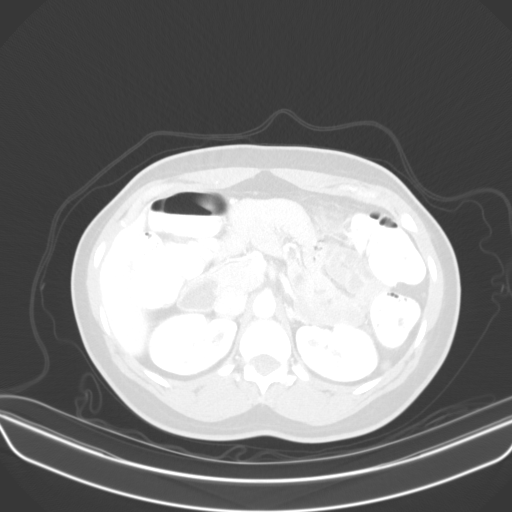
[im 70/82  soft-tissue]
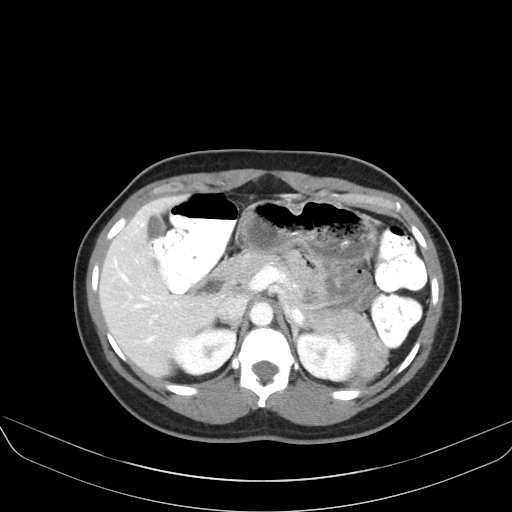
[im 70/82  lung]
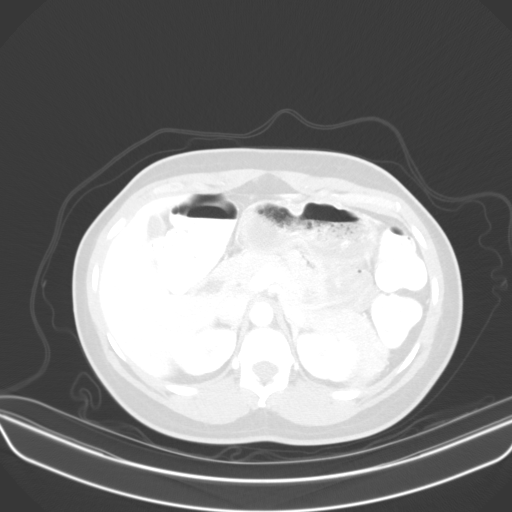
[im 74/82  lung]
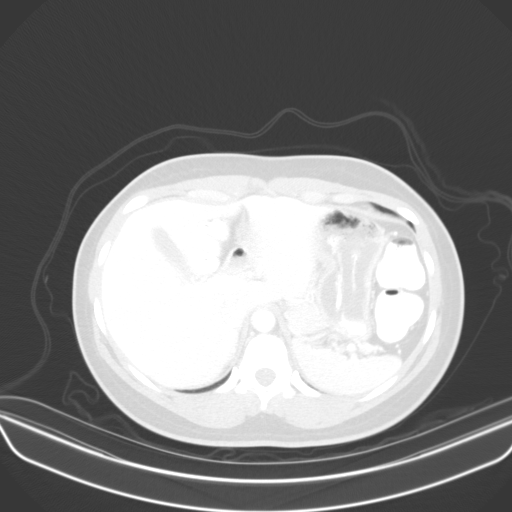
[im 78/82  soft-tissue]
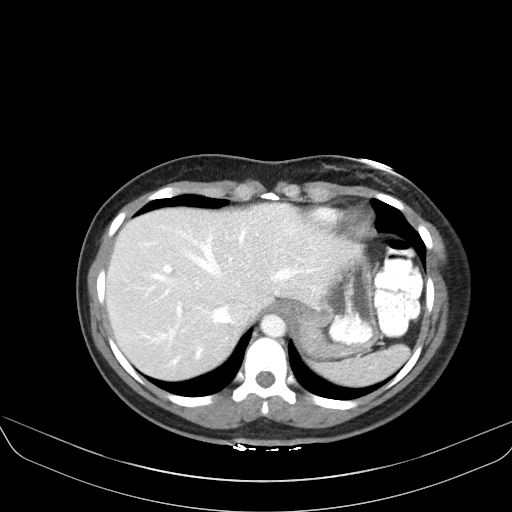
[im 78/82  lung]
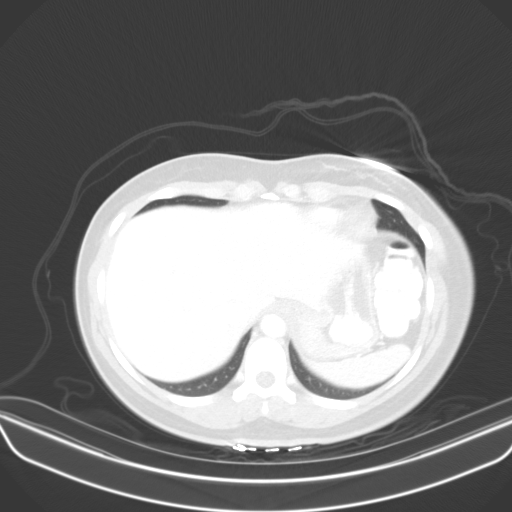

[14 of 32 positions shown; findings below may reference images not displayed]

FINDINGS: Lower chest: Unremarkable.

Hepatobiliary: No focal abnormality within the liver parenchyma..
Extreme dome of liver not included on study due to patient motion
immediately prior to scanning. There is no evidence for gallstones,
gallbladder wall thickening, or pericholecystic fluid. No
intrahepatic or extrahepatic biliary dilation.

Pancreas: No focal mass lesion. No dilatation of the main duct. No
intraparenchymal cyst. No peripancreatic edema.

Spleen: No splenomegaly. No focal mass lesion.

Adrenals/Urinary Tract: No adrenal nodule or mass. Kidneys
unremarkable. No evidence for hydroureter. The urinary bladder
appears normal for the degree of distention.

Stomach/Bowel: Wall thickening noted in the proximal stomach, just
distal to the esophagogastric junction (series 2: Image 6). This may
be related to peristalsis but stomach is otherwise well distended.
Duodenum is normally positioned as is the ligament of Treitz. No
small bowel wall thickening. No small bowel dilatation. No gross
colonic mass. No colonic wall thickening. No substantial
diverticular change.

Vascular/Lymphatic: No abdominal aortic aneurysm. There is no
gastrohepatic or hepatoduodenal ligament lymphadenopathy. No
intraperitoneal or retroperitoneal lymphadenopathy. No pelvic
sidewall lymphadenopathy.

Reproductive: Uterus surgically absent. 4.0 x 3.6 cm right adnexal
cystic lesion with possible mural nodularity along the lateral
margin ([DATE]). No left adnexal mass.

Other: No intraperitoneal free fluid.

Musculoskeletal: No worrisome lytic or sclerotic osseous
abnormality. No ventral hernia. No evidence for groin hernia
IMPRESSION: 1. Focal marked wall thickening in the proximal stomach. Potentially
related to peristalsis although appearance is atypical given the
degree of gastric distention. Upper endoscopy recommended to further
evaluate.
2. 4.0 x 3.6 cm right adnexal cystic mass with apparent subtle mural
nodule. Pelvic ultrasound recommended to further evaluate.
3. No definite findings to explain the patient's history of left
lower quadrant pain. No evidence for abdominal wall hernia.

## 2018-02-19 MED ORDER — IOPAMIDOL (ISOVUE-300) INJECTION 61%
100.0000 mL | Freq: Once | INTRAVENOUS | Status: AC | PRN
  Administered 2018-02-19: 100 mL via INTRAVENOUS

## 2018-02-27 DIAGNOSIS — N3001 Acute cystitis with hematuria: Secondary | ICD-10-CM | POA: Diagnosis not present

## 2018-02-27 DIAGNOSIS — F419 Anxiety disorder, unspecified: Secondary | ICD-10-CM | POA: Diagnosis not present

## 2018-04-13 DIAGNOSIS — R12 Heartburn: Secondary | ICD-10-CM | POA: Diagnosis not present

## 2018-04-13 DIAGNOSIS — K625 Hemorrhage of anus and rectum: Secondary | ICD-10-CM | POA: Diagnosis not present

## 2018-04-13 DIAGNOSIS — R933 Abnormal findings on diagnostic imaging of other parts of digestive tract: Secondary | ICD-10-CM | POA: Diagnosis not present

## 2018-04-13 DIAGNOSIS — K219 Gastro-esophageal reflux disease without esophagitis: Secondary | ICD-10-CM | POA: Diagnosis not present

## 2018-09-12 ENCOUNTER — Ambulatory Visit: Payer: BLUE CROSS/BLUE SHIELD | Admitting: Family Medicine

## 2018-09-12 ENCOUNTER — Other Ambulatory Visit: Payer: Self-pay

## 2018-09-12 ENCOUNTER — Encounter: Payer: Self-pay | Admitting: Family Medicine

## 2018-09-12 VITALS — BP 106/72 | HR 91 | Temp 98.0°F | Ht 60.0 in | Wt 129.8 lb

## 2018-09-12 DIAGNOSIS — Z09 Encounter for follow-up examination after completed treatment for conditions other than malignant neoplasm: Secondary | ICD-10-CM | POA: Diagnosis not present

## 2018-09-12 DIAGNOSIS — R6889 Other general symptoms and signs: Secondary | ICD-10-CM

## 2018-09-12 LAB — POCT INFLUENZA A/B
Influenza A, POC: NEGATIVE
Influenza B, POC: NEGATIVE

## 2018-09-12 NOTE — Progress Notes (Signed)
Established Patient Office Visit  Subjective:  Patient ID: Jodi Acosta, female    DOB: Nov 22, 1975  Age: 43 y.o. MRN: [id number]  CC:  Chief Complaint  Patient presents with  . Night Sweats    started monday night   . Chills  . Generalized Body Aches  . Headache  . neck sorness    stiffiness    HPI Jodi Acosta is a 43 year old female who presents for Sick Visit today.   Past Medical History:  Diagnosis Date  . Allergy   . Anemia   . Anxiety   . Parotiditis 12/25/2015   CBC, ANA, and ACE level were all negative   Current Status: Since her last office visit, she has been having flu-like symptoms since Monday. She reports body aches, headaches, cough, chills, congestion, and fevers. She denies fatigue, recent infections, weight loss, and night sweats. She is taking OTC Cold/Flu medication and reports that her symptoms are mildly improving. Her anxiety is mild today. She denies suicidal ideations, homicidal ideations, or auditory hallucinations.  She has not had any visual changes, dizziness, and falls. No chest pain, heart palpitations, cough and shortness of breath reported. No reports of GI problems such as nausea, vomiting, diarrhea, and constipation. She has no reports of blood in stools, dysuria and hematuria. No depression or anxiety, and  She denies pain today.      Past Surgical History:  Procedure Laterality Date  . ABDOMINAL HYSTERECTOMY  03/2017  . BREAST SURGERY     15 yrs ago  . CESAREAN SECTION     21 yrs ago  . TUBAL LIGATION     19 yr ago    Family History  Problem Relation Age of Onset  . Heart disease Mother   . Hypertension Mother   . Heart disease Father   . Hypertension Father   . Hyperlipidemia Father   . Diabetes Sister   . Hypertension Sister   . Diabetes Maternal Grandmother   . Hypertension Maternal Grandmother   . Cancer Maternal Grandfather   . Diabetes Maternal Grandfather   . Hypertension Maternal Grandfather     Social  History   Socioeconomic History  . Marital status: Single    Spouse name: Not on file  . Number of children: Not on file  . Years of education: Not on file  . Highest education level: Not on file  Occupational History  . Not on file  Social Needs  . Financial resource strain: Not on file  . Food insecurity:    Worry: Not on file    Inability: Not on file  . Transportation needs:    Medical: Not on file    Non-medical: Not on file  Tobacco Use  . Smoking status: Light Tobacco Smoker    Types: Cigarettes  . Smokeless tobacco: Never Used  . Tobacco comment: occasionally  Substance and Sexual Activity  . Alcohol use: Yes    Alcohol/week: 1.0 standard drinks    Types: 1 Glasses of wine per week    Comment: social  . Drug use: No  . Sexual activity: Not on file  Lifestyle  . Physical activity:    Days per week: Not on file    Minutes per session: Not on file  . Stress: Not on file  Relationships  . Social connections:    Talks on phone: Not on file    Gets together: Not on file    Attends religious service: Not on file  Active member of club or organization: Not on file    Attends meetings of clubs or organizations: Not on file    Relationship status: Not on file  . Intimate partner violence:    Fear of current or ex partner: Not on file    Emotionally abused: Not on file    Physically abused: Not on file    Forced sexual activity: Not on file  Other Topics Concern  . Not on file  Social History Narrative  . Not on file    Outpatient Medications Prior to Visit  Medication Sig Dispense Refill  . hydrOXYzine (ATARAX/VISTARIL) 25 MG tablet TAKE 0.5-3 TABLETS (12.5-75 MG TOTAL) BY MOUTH AT BEDTIME AS NEEDED FOR ITCHING. 30 tablet 0  . LORazepam (ATIVAN) 1 MG tablet TAKE 1 TABLET BY MOUTH TWICE A DAY 30 tablet 0  . triamcinolone cream (KENALOG) 0.1 % Apply 1 application topically 2 (two) times daily. 45 g 1  . amoxicillin (AMOXIL) 500 MG capsule     . cetirizine  (ZYRTEC) 10 MG tablet Take 1 tablet (10 mg total) by mouth daily. (Patient not taking: Reported on 11/21/2017) 90 tablet 3  . ranitidine (ZANTAC) 150 MG tablet TAKE 1 TABLET (150 MG TOTAL) BY MOUTH 2 (TWO) TIMES DAILY WITH A MEAL. 60 tablet 0   No facility-administered medications prior to visit.     No Known Allergies  ROS Review of Systems  Constitutional: Positive for chills and fatigue.  Eyes: Negative.   Respiratory: Positive for cough.   Cardiovascular: Negative.   Gastrointestinal: Negative.   Endocrine: Negative.   Genitourinary: Negative.   Musculoskeletal: Negative.   Skin: Negative.   Allergic/Immunologic: Negative.   Neurological: Negative.   Hematological: Negative.   Psychiatric/Behavioral: Negative.    Objective:    Physical Exam  Constitutional: She is oriented to person, place, and time. She appears well-developed and well-nourished.  HENT:  Head: Normocephalic and atraumatic.  Eyes: Conjunctivae are normal.  Neck: Normal range of motion. Neck supple.  Cardiovascular: Normal rate, regular rhythm, normal heart sounds and intact distal pulses.  Pulmonary/Chest: Effort normal and breath sounds normal.  Abdominal: Soft. Bowel sounds are normal.  Musculoskeletal: Normal range of motion.  Neurological: She is alert and oriented to person, place, and time.  Skin: Skin is warm and dry.  Psychiatric: She has a normal mood and affect. Her behavior is normal. Judgment and thought content normal.    BP 106/72 (BP Location: Right Arm, Patient Position: Sitting, Cuff Size: Normal)   Pulse 91   Temp 98 F (36.7 C) (Oral)   Ht 5' (1.524 m)   Wt 129 lb 12.8 oz (58.9 kg)   SpO2 98%   BMI 25.35 kg/m  Wt Readings from Last 3 Encounters:  09/12/18 129 lb 12.8 oz (58.9 kg)  11/21/17 130 lb (59 kg)  11/11/17 132 lb (59.9 kg)     Health Maintenance Due  Topic Date Due  . HIV Screening  12/22/1992  . TETANUS/TDAP  12/22/1996  . PAP SMEAR-Modifier  12/23/1998  .  INFLUENZA VACCINE  02/19/2018    There are no preventive care reminders to display for this patient.  No results found for: TSH Lab Results  Component Value Date   WBC 7.9 10/02/2017   HGB 12.2 10/02/2017   HCT 37.3 10/02/2017   MCV 79 10/02/2017   PLT 367 10/02/2017   Lab Results  Component Value Date   NA 140 10/02/2017   K 3.8 10/02/2017   CO2 20  10/02/2017   GLUCOSE 73 10/02/2017   BUN 8 10/02/2017   CREATININE 0.80 10/02/2017   BILITOT 0.2 10/02/2017   ALKPHOS 60 10/02/2017   AST 26 10/02/2017   ALT 13 10/02/2017   PROT 8.0 10/02/2017   ALBUMIN 4.8 10/02/2017   CALCIUM 9.6 10/02/2017   No results found for: CHOL No results found for: HDL No results found for: LDLCALC No results found for: TRIG No results found for: CHOLHDL No results found for: ZOXW9U    Assessment & Plan:   1. Flu-like symptoms Results for Influenza are negative. She will continue OTC Cold/Flu medication, increase fluids, get plenty of rest. She will report to office if symptoms do not improve or worsen.  - POCT Influenza A/B  2. Follow up She will follow up as needed.   No orders of the defined types were placed in this encounter.   Orders Placed This Encounter  Procedures  . POCT Influenza A/B   Referral Orders  No referral(s) requested today   Raliegh Ip,  MSN, FNP-C [facility] [address] [place], [place] [address] [phone]  Problem List Items Addressed This Visit    None    Visit Diagnoses    Flu-like symptoms    -  Primary   Relevant Orders   POCT Influenza A/B (Completed)   Follow up          No orders of the defined types were placed in this encounter.   Follow-up: No follow-ups on file.    Kallie Locks, FNP

## 2018-09-12 NOTE — Patient Instructions (Addendum)
If you have lab work done today you will be contacted with your lab results within the next 2 weeks.  If you have not heard from [place] then please contact us. The fastest way to get your results is to register for My Chart.   IF you received an x-ray today, you will receive an invoice from [facility]. Please contact [facility] at [phone] with questions or concerns regarding your invoice.   IF you received labwork today, you will receive an invoice from [facility]. Please contact [facility] at [phone] with questions or concerns regarding your invoice.   Our billing staff will not be able to assist you with questions regarding bills from these companies.  You will be contacted with the lab results as soon as they are available. The fastest way to get your results is to activate your My Chart account. Instructions are located on the last page of this paperwork. If you have not heard from [place] regarding the results in 2 weeks, please contact this office.     Ibuprofen tablets and capsules What is this medicine? IBUPROFEN (eye BYOO proe fen) is a non-steroidal anti-inflammatory drug (NSAID). It is used for dental pain, fever, headaches or migraines, osteoarthritis, rheumatoid arthritis, or painful monthly periods. It can also relieve minor aches and pains caused by a cold, flu, or sore throat. This medicine may be used for other purposes; ask your health care provider or pharmacist if you have questions. COMMON BRAND NAME(S): Advil, Advil Junior Strength, Advil Migraine, Genpril, Ibren, IBU, Midol, Midol Cramps and Body Aches, Motrin, Motrin IB, Motrin Junior Strength, Motrin Migraine Pain, Samson-8, Toxicology Saliva Collection What should I tell my health care provider before I take this medicine? They need to know if you have any of these conditions: -cigarette smoker -coronary artery bypass graft (CABG) surgery within the past 2 weeks -drink more than 3  alcohol-containing drinks a day -heart disease -high blood pressure -history of stomach bleeding -kidney disease -liver disease -lung or breathing disease, like asthma -an unusual or allergic reaction to ibuprofen, aspirin, other NSAIDs, other medicines, foods, dyes, or preservatives -pregnant or trying to get pregnant -breast-feeding How should I use this medicine? Take this medicine by mouth with a glass of water. Follow the directions on the prescription label. Take this medicine with food if your stomach gets upset. Try to not lie down for at least 10 minutes after you take the medicine. Take your medicine at regular intervals. Do not take your medicine more often than directed. A special MedGuide will be given to you by the pharmacist with each prescription and refill. Be sure to read this information carefully each time. Talk to your pediatrician regarding the use of this medicine in children. Special care may be needed. Overdosage: If you think you have taken too much of this medicine contact a poison control center or emergency room at once. NOTE: This medicine is only for you. Do not share this medicine with others. What if I miss a dose? If you miss a dose, take it as soon as you can. If it is almost time for your next dose, take only that dose. Do not take double or extra doses. What may interact with this medicine? Do not take this medicine with any of the following medications: -cidofovir -ketorolac -methotrexate -pemetrexed This medicine may also interact with the following medications: -alcohol -aspirin -diuretics -lithium -other drugs for inflammation like prednisone -warfarin This list may not describe all possible interactions.  Give your health care provider a list of all the medicines, herbs, non-prescription drugs, or dietary supplements you use. Also tell them if you smoke, drink alcohol, or use illegal drugs. Some items may interact with your medicine. What  should I watch for while using this medicine? Tell your doctor or healthcare professional if your symptoms do not start to get better or if they get worse. This medicine does not prevent heart attack or stroke. In fact, this medicine may increase the chance of a heart attack or stroke. The chance may increase with longer use of this medicine and in people who have heart disease. If you take aspirin to prevent heart attack or stroke, talk with your doctor or health care professional. Do not take other medicines that contain aspirin, ibuprofen, or naproxen with this medicine. Side effects such as stomach upset, nausea, or ulcers may be more likely to occur. Many medicines available without a prescription should not be taken with this medicine. This medicine can cause ulcers and bleeding in the stomach and intestines at any time during treatment. Ulcers and bleeding can happen without warning symptoms and can cause death. To reduce your risk, do not smoke cigarettes or drink alcohol while you are taking this medicine. You may get drowsy or dizzy. Do not drive, use machinery, or do anything that needs mental alertness until you know how this medicine affects you. Do not stand or sit up quickly, especially if you are an older patient. This reduces the risk of dizzy or fainting spells. This medicine can cause you to bleed more easily. Try to avoid damage to your teeth and gums when you brush or floss your teeth. This medicine may be used to treat migraines. If you take migraine medicines for 10 or more days a month, your migraines may get worse. Keep a diary of headache days and medicine use. Contact your healthcare professional if your migraine attacks occur more frequently. What side effects may I notice from receiving this medicine? Side effects that you should report to your doctor or health care professional as soon as possible: -allergic reactions like skin rash, itching or hives, swelling of the face,  lips, or tongue -severe stomach pain -signs and symptoms of bleeding such as bloody or black, tarry stools; red or dark-brown urine; spitting up blood or brown material that looks like coffee grounds; red spots on the skin; unusual bruising or bleeding from the eye, gums, or nose -signs and symptoms of a blood clot such as changes in vision; chest pain; severe, sudden headache; trouble speaking; sudden numbness or weakness of the face, arm, or leg -unexplained weight gain or swelling -unusually weak or tired -yellowing of eyes or skin Side effects that usually do not require medical attention (report to your doctor or health care professional if they continue or are bothersome): -bruising -diarrhea -dizziness, drowsiness -headache -nausea, vomiting This list may not describe all possible side effects. Call your doctor for medical advice about side effects. You may report side effects to FDA at 1-800-FDA-1088. Where should I keep my medicine? Keep out of the reach of children. Store at room temperature between 15 and 30 degrees C (59 and 86 degrees F). Keep container tightly closed. Throw away any unused medicine after the expiration date. NOTE: This sheet is a summary. It may not cover all possible information. If you have questions about this medicine, talk to your doctor, pharmacist, or health care provider.  2019 Elsevier/Gold Standard (2017-03-12 12:43:57)  Acetaminophen tablets  or caplets What is this medicine? ACETAMINOPHEN (a set a MEE noe fen) is a pain reliever. It is used to treat mild pain and fever. This medicine may be used for other purposes; ask your health care provider or pharmacist if you have questions. COMMON BRAND NAME(S): Aceta, Actamin, Anacin Aspirin Free, Genapap, Genebs, Mapap, Pain & Fever, Pain and Fever, PAIN RELIEF, PAIN RELIEF Extra Strength, Pain Reliever, Panadol, PHARBETOL, Q-Pap, Q-Pap Extra Strength, Tylenol, Tylenol CrushableTablet, Tylenol Extra  Strength, XS No Aspirin, XS Pain Reliever What should I tell my health care provider before I take this medicine? They need to know if you have any of these conditions: -if you often drink alcohol -liver disease -an unusual or allergic reaction to acetaminophen, other medicines, foods, dyes, or preservatives -pregnant or trying to get pregnant -breast-feeding How should I use this medicine? Take this medicine by mouth with a glass of water. Follow the directions on the package or prescription label. Take your medicine at regular intervals. Do not take your medicine more often than directed. Talk to your pediatrician regarding the use of this medicine in children. While this drug may be prescribed for children as young as 51 years of age for selected conditions, precautions do apply. Overdosage: If you think you have taken too much of this medicine contact a poison control center or emergency room at once. NOTE: This medicine is only for you. Do not share this medicine with others. What if I miss a dose? If you miss a dose, take it as soon as you can. If it is almost time for your next dose, take only that dose. Do not take double or extra doses. What may interact with this medicine? -alcohol -imatinib -isoniazid -other medicines with acetaminophen This list may not describe all possible interactions. Give your health care provider a list of all the medicines, herbs, non-prescription drugs, or dietary supplements you use. Also tell them if you smoke, drink alcohol, or use illegal drugs. Some items may interact with your medicine. What should I watch for while using this medicine? Tell your doctor or health care professional if the pain lasts more than 10 days (5 days for children), if it gets worse, or if there is a new or different kind of pain. Also, check with your doctor if a fever lasts for more than 3 days. Do not take other medicines that contain acetaminophen with this medicine. Always  read labels carefully. If you have questions, ask your doctor or pharmacist. If you take too much acetaminophen get medical help right away. Too much acetaminophen can be very dangerous and cause liver damage. Even if you do not have symptoms, it is important to get help right away. What side effects may I notice from receiving this medicine? Side effects that you should report to your doctor or health care professional as soon as possible: -allergic reactions like skin rash, itching or hives, swelling of the face, lips, or tongue -breathing problems -fever or sore throat -redness, blistering, peeling or loosening of the skin, including inside the mouth -trouble passing urine or change in the amount of urine -unusual bleeding or bruising -unusually weak or tired -yellowing of the eyes or skin Side effects that usually do not require medical attention (report to your doctor or health care professional if they continue or are bothersome): -headache -nausea, stomach upset This list may not describe all possible side effects. Call your doctor for medical advice about side effects. You may report side effects to  FDA at 1-800-FDA-1088. Where should I keep my medicine? Keep out of reach of children. Store at room temperature between 20 and 25 degrees C (68 and 77 degrees F). Protect from moisture and heat. Throw away any unused medicine after the expiration date. NOTE: This sheet is a summary. It may not cover all possible information. If you have questions about this medicine, talk to your doctor, pharmacist, or health care provider.  2019 Elsevier/Gold Standard (2013-03-01 12:54:16) Ibuprofen tablets and capsules What is this medicine? IBUPROFEN (eye BYOO proe fen) is a non-steroidal anti-inflammatory drug (NSAID). It is used for dental pain, fever, headaches or migraines, osteoarthritis, rheumatoid arthritis, or painful monthly periods. It can also relieve minor aches and pains caused by a cold,  flu, or sore throat. This medicine may be used for other purposes; ask your health care provider or pharmacist if you have questions. COMMON BRAND NAME(S): Advil, Advil Junior Strength, Advil Migraine, Genpril, Ibren, IBU, Midol, Midol Cramps and Body Aches, Motrin, Motrin IB, Motrin Junior Strength, Motrin Migraine Pain, Samson-8, Toxicology Saliva Collection What should I tell my health care provider before I take this medicine? They need to know if you have any of these conditions: -cigarette smoker -coronary artery bypass graft (CABG) surgery within the past 2 weeks -drink more than 3 alcohol-containing drinks a day -heart disease -high blood pressure -history of stomach bleeding -kidney disease -liver disease -lung or breathing disease, like asthma -an unusual or allergic reaction to ibuprofen, aspirin, other NSAIDs, other medicines, foods, dyes, or preservatives -pregnant or trying to get pregnant -breast-feeding How should I use this medicine? Take this medicine by mouth with a glass of water. Follow the directions on the prescription label. Take this medicine with food if your stomach gets upset. Try to not lie down for at least 10 minutes after you take the medicine. Take your medicine at regular intervals. Do not take your medicine more often than directed. A special MedGuide will be given to you by the pharmacist with each prescription and refill. Be sure to read this information carefully each time. Talk to your pediatrician regarding the use of this medicine in children. Special care may be needed. Overdosage: If you think you have taken too much of this medicine contact a poison control center or emergency room at once. NOTE: This medicine is only for you. Do not share this medicine with others. What if I miss a dose? If you miss a dose, take it as soon as you can. If it is almost time for your next dose, take only that dose. Do not take double or extra doses. What may interact  with this medicine? Do not take this medicine with any of the following medications: -cidofovir -ketorolac -methotrexate -pemetrexed This medicine may also interact with the following medications: -alcohol -aspirin -diuretics -lithium -other drugs for inflammation like prednisone -warfarin This list may not describe all possible interactions. Give your health care provider a list of all the medicines, herbs, non-prescription drugs, or dietary supplements you use. Also tell them if you smoke, drink alcohol, or use illegal drugs. Some items may interact with your medicine. What should I watch for while using this medicine? Tell your doctor or healthcare professional if your symptoms do not start to get better or if they get worse. This medicine does not prevent heart attack or stroke. In fact, this medicine may increase the chance of a heart attack or stroke. The chance may increase with longer use of this medicine and in  people who have heart disease. If you take aspirin to prevent heart attack or stroke, talk with your doctor or health care professional. Do not take other medicines that contain aspirin, ibuprofen, or naproxen with this medicine. Side effects such as stomach upset, nausea, or ulcers may be more likely to occur. Many medicines available without a prescription should not be taken with this medicine. This medicine can cause ulcers and bleeding in the stomach and intestines at any time during treatment. Ulcers and bleeding can happen without warning symptoms and can cause death. To reduce your risk, do not smoke cigarettes or drink alcohol while you are taking this medicine. You may get drowsy or dizzy. Do not drive, use machinery, or do anything that needs mental alertness until you know how this medicine affects you. Do not stand or sit up quickly, especially if you are an older patient. This reduces the risk of dizzy or fainting spells. This medicine can cause you to bleed more  easily. Try to avoid damage to your teeth and gums when you brush or floss your teeth. This medicine may be used to treat migraines. If you take migraine medicines for 10 or more days a month, your migraines may get worse. Keep a diary of headache days and medicine use. Contact your healthcare professional if your migraine attacks occur more frequently. What side effects may I notice from receiving this medicine? Side effects that you should report to your doctor or health care professional as soon as possible: -allergic reactions like skin rash, itching or hives, swelling of the face, lips, or tongue -severe stomach pain -signs and symptoms of bleeding such as bloody or black, tarry stools; red or dark-brown urine; spitting up blood or brown material that looks like coffee grounds; red spots on the skin; unusual bruising or bleeding from the eye, gums, or nose -signs and symptoms of a blood clot such as changes in vision; chest pain; severe, sudden headache; trouble speaking; sudden numbness or weakness of the face, arm, or leg -unexplained weight gain or swelling -unusually weak or tired -yellowing of eyes or skin Side effects that usually do not require medical attention (report to your doctor or health care professional if they continue or are bothersome): -bruising -diarrhea -dizziness, drowsiness -headache -nausea, vomiting This list may not describe all possible side effects. Call your doctor for medical advice about side effects. You may report side effects to FDA at 1-800-FDA-1088. Where should I keep my medicine? Keep out of the reach of children. Store at room temperature between 15 and 30 degrees C (59 and 86 degrees F). Keep container tightly closed. Throw away any unused medicine after the expiration date. NOTE: This sheet is a summary. It may not cover all possible information. If you have questions about this medicine, talk to your doctor, pharmacist, or health care provider.   2019 Elsevier/Gold Standard (2017-03-12 12:43:57)  Viral Respiratory Infection A viral respiratory infection is an illness that affects parts of the body that are used for breathing. These include the lungs, nose, and throat. It is caused by a germ called a virus. Some examples of this kind of infection are:  A cold.  The flu (influenza).  A respiratory syncytial virus (RSV) infection. A person who gets this illness may have the following symptoms:  A stuffy or runny nose.  Yellow or green fluid in the nose.  A cough.  Sneezing.  Tiredness (fatigue).  Achy muscles.  A sore throat.  Sweating or chills.  A fever.  A headache. Follow these instructions at home: Managing pain and congestion  Take over-the-counter and prescription medicines only as told by your doctor.  If you have a sore throat, gargle with salt water. Do this 3-4 times per day or as needed. To make a salt-water mixture, dissolve -1 tsp of salt in 1 cup of warm water. Make sure that all the salt dissolves.  Use nose drops made from salt water. This helps with stuffiness (congestion). It also helps soften the skin around your nose.  Drink enough fluid to keep your pee (urine) pale yellow. General instructions   Rest as much as possible.  Do not drink alcohol.  Do not use any products that have nicotine or tobacco, such as cigarettes and e-cigarettes. If you need help quitting, ask your doctor.  Keep all follow-up visits as told by your doctor. This is important. How is this prevented?   Get a flu shot every year. Ask your doctor when you should get your flu shot.  Do not let other people get your germs. If you are sick: ? Stay home from work or school. ? Wash your hands with soap and water often. Wash your hands after you cough or sneeze. If soap and water are not available, use hand sanitizer.  Avoid contact with people who are sick during cold and flu season. This is in fall and winter. Get  help if:  Your symptoms last for 10 days or longer.  Your symptoms get worse over time.  You have a fever.  You have very bad pain in your face or forehead.  Parts of your jaw or neck become very swollen. Get help right away if:  You feel pain or pressure in your chest.  You have shortness of breath.  You faint or feel like you will faint.  You keep throwing up (vomiting).  You feel confused. Summary  A viral respiratory infection is an illness that affects parts of the body that are used for breathing.  Examples of this illness include a cold, the flu, and respiratory syncytial virus (RSV) infection.  The infection can cause a runny nose, cough, sneezing, sore throat, and fever.  Follow what your doctor tells you about taking medicines, drinking lots of fluid, washing your hands, resting at home, and avoiding people who are sick. This information is not intended to replace advice given to you by your health care provider. Make sure you discuss any questions you have with your health care provider. Document Released: 06/20/2008 Document Revised: 08/18/2017 Document Reviewed: 08/18/2017 Elsevier Interactive Patient Education  2019 [employer].

## 2019-07-27 DIAGNOSIS — Z111 Encounter for screening for respiratory tuberculosis: Secondary | ICD-10-CM | POA: Diagnosis not present

## 2019-07-27 DIAGNOSIS — Z808 Family history of malignant neoplasm of other organs or systems: Secondary | ICD-10-CM | POA: Diagnosis not present

## 2019-07-27 DIAGNOSIS — Z79899 Other long term (current) drug therapy: Secondary | ICD-10-CM | POA: Diagnosis not present

## 2019-07-27 DIAGNOSIS — E010 Iodine-deficiency related diffuse (endemic) goiter: Secondary | ICD-10-CM | POA: Diagnosis not present

## 2020-03-09 DIAGNOSIS — L720 Epidermal cyst: Secondary | ICD-10-CM | POA: Diagnosis not present

## 2020-03-09 DIAGNOSIS — R030 Elevated blood-pressure reading, without diagnosis of hypertension: Secondary | ICD-10-CM | POA: Diagnosis not present

## 2020-03-22 DIAGNOSIS — F410 Panic disorder [episodic paroxysmal anxiety] without agoraphobia: Secondary | ICD-10-CM | POA: Diagnosis not present

## 2020-03-22 DIAGNOSIS — L729 Follicular cyst of the skin and subcutaneous tissue, unspecified: Secondary | ICD-10-CM | POA: Diagnosis not present

## 2020-03-22 DIAGNOSIS — F419 Anxiety disorder, unspecified: Secondary | ICD-10-CM | POA: Diagnosis not present

## 2020-03-23 DIAGNOSIS — L700 Acne vulgaris: Secondary | ICD-10-CM | POA: Diagnosis not present
